# Patient Record
Sex: Female | Born: 1995 | Race: White | Hispanic: No | Marital: Single | Smoking: Never smoker
Health system: Southern US, Community
[De-identification: ages and names within clinical notes are randomized; demographics above are authoritative.]

## PROBLEM LIST (undated history)

## (undated) DIAGNOSIS — K219 Gastro-esophageal reflux disease without esophagitis: Secondary | ICD-10-CM

## (undated) DIAGNOSIS — G43909 Migraine, unspecified, not intractable, without status migrainosus: Secondary | ICD-10-CM

## (undated) DIAGNOSIS — J45909 Unspecified asthma, uncomplicated: Secondary | ICD-10-CM

---
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
## 2015-12-18 IMAGING — CR DG NECK SOFT TISSUE
2 series · 2 of 2 positions shown · non-contrast
Comparison: Cervical spine radiographs performed 03/17/2013

CLINICAL DATA: Sore throat and painful swallowing. Congestion,
cough, body aches and runny nose.

EXAM:
NECK SOFT TISSUES - 1+ VIEW

[w soft tissue neck]
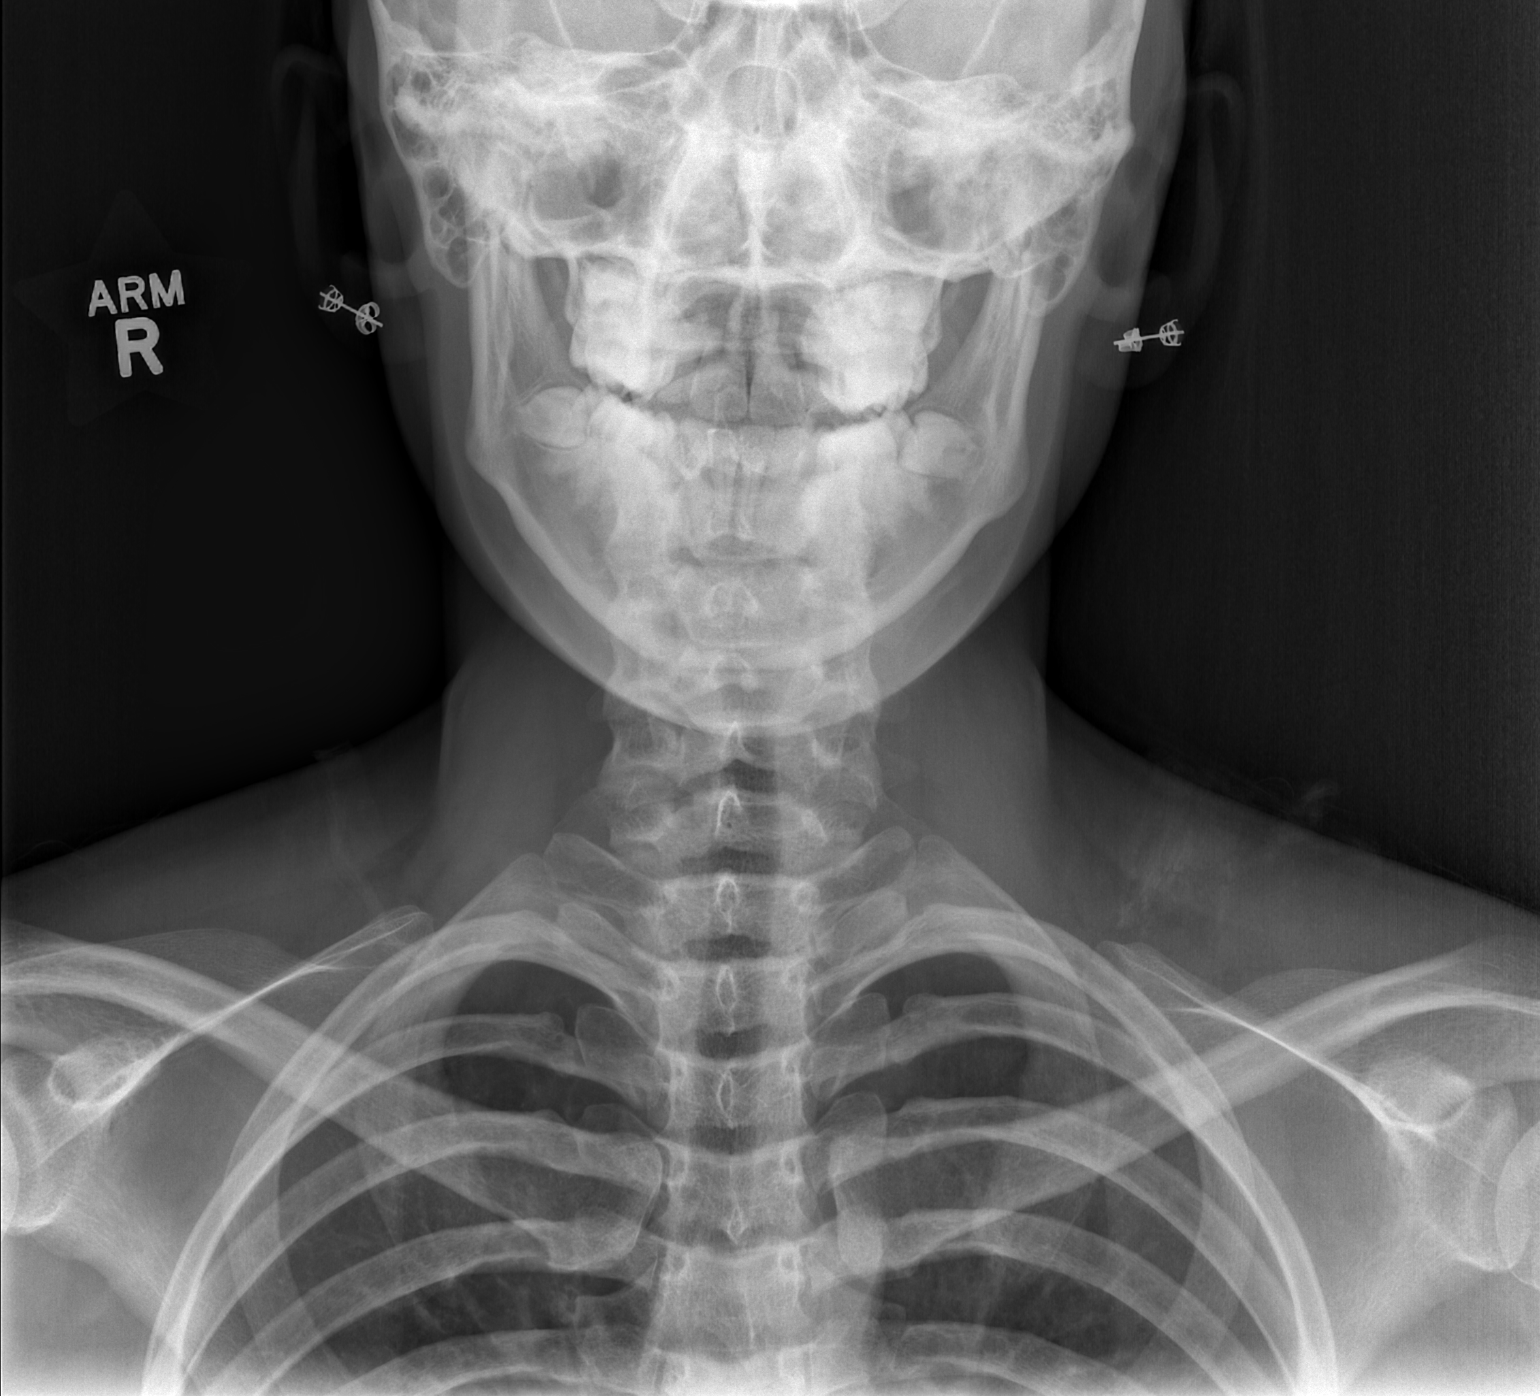

[w soft tissue neck ap]
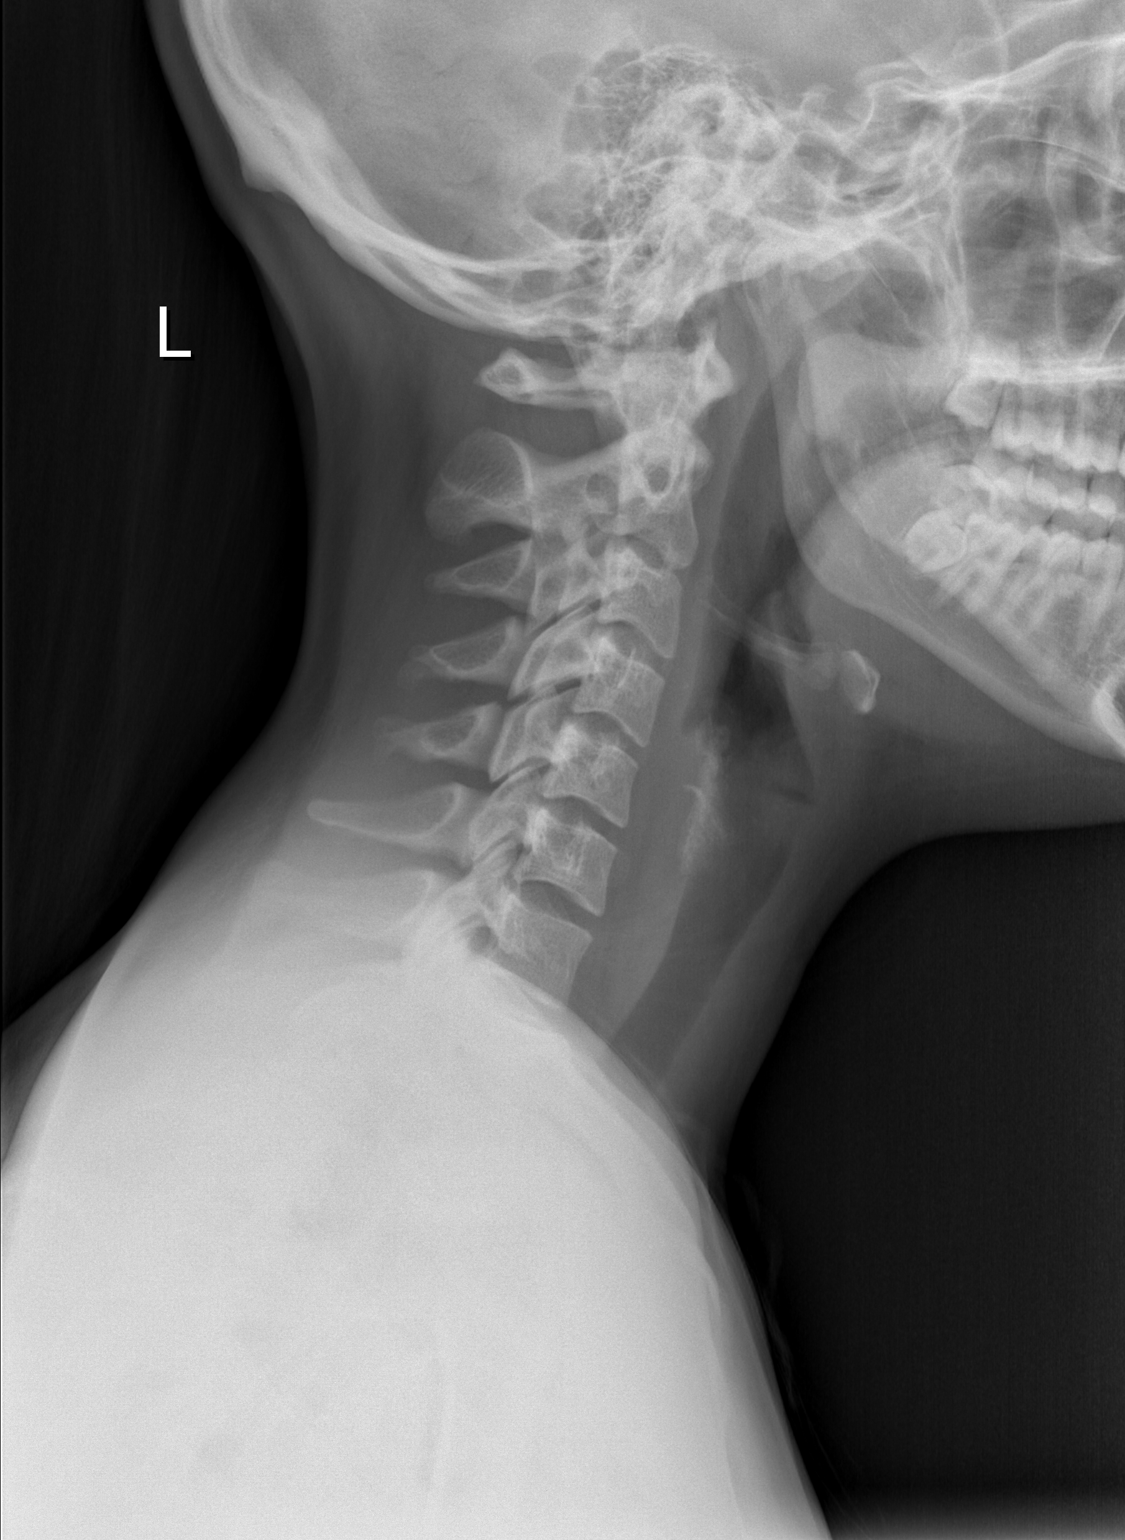

[2 of 2 positions shown; findings below may reference images not displayed]

FINDINGS: The nasopharynx, oropharynx and hypopharynx are unremarkable in
appearance. The epiglottis is normal in thickness. The aryepiglottic
folds are grossly unremarkable. The proximal trachea is within
normal limits. Prevertebral soft tissues are normal in thickness.

No acute osseous abnormalities are seen. Vertebral bodies
demonstrate normal height and alignment. Intervertebral disc spaces
are preserved. The visualized lung apices are clear. The visualized
paranasal sinuses and mastoid air cells are well aerated.
IMPRESSION: Unremarkable radiograph of the soft tissues of the neck.
